# Patient Record
Sex: Female | Born: 1980 | Race: White | Hispanic: No | Marital: Married
Health system: Southern US, Community
[De-identification: ages and names within clinical notes are randomized; demographics above are authoritative.]

## PROBLEM LIST (undated history)

## (undated) DIAGNOSIS — F419 Anxiety disorder, unspecified: Secondary | ICD-10-CM

---

## 2005-07-28 ENCOUNTER — Emergency Department: Payer: Self-pay | Admitting: Emergency Medicine

## 2012-01-19 ENCOUNTER — Encounter: Payer: Self-pay | Admitting: Maternal & Fetal Medicine

## 2012-01-30 ENCOUNTER — Observation Stay: Payer: Self-pay | Admitting: Obstetrics and Gynecology

## 2012-02-11 ENCOUNTER — Inpatient Hospital Stay: Payer: Self-pay | Admitting: Obstetrics and Gynecology

## 2012-02-11 LAB — CBC WITH DIFFERENTIAL/PLATELET
Basophil #: 0 10*3/uL (ref 0.0–0.1)
Eosinophil #: 0.2 10*3/uL (ref 0.0–0.7)
HCT: 34.8 % — ABNORMAL LOW (ref 35.0–47.0)
Lymphocyte %: 15.8 %
MCHC: 34.3 g/dL (ref 32.0–36.0)
MCV: 85 fL (ref 80–100)
Monocyte %: 6.8 %
RDW: 14.8 % — ABNORMAL HIGH (ref 11.5–14.5)

## 2012-02-12 LAB — HEPATIC FUNCTION PANEL A (ARMC)
Albumin: 2.6 g/dL — ABNORMAL LOW (ref 3.4–5.0)
Alkaline Phosphatase: 147 U/L — ABNORMAL HIGH (ref 50–136)
Bilirubin, Direct: <0.1 mg/dL (ref 0.00–0.20)
Bilirubin,Total: 0.2 mg/dL (ref 0.2–1.0)
SGPT (ALT): 12 U/L (ref 12–78)
Total Protein: 6 g/dL — ABNORMAL LOW (ref 6.4–8.2)

## 2012-02-13 LAB — HEMATOCRIT: HCT: 31 % — ABNORMAL LOW (ref 35.0–47.0)

## 2013-10-23 IMAGING — US US OB DETAIL+14 WK - NRPT [facility]
1 series · 14 of 28 positions shown · non-contrast
Comparison: none

[Series 1: us ob detail+14 wk - nrpt [facility] · 0.26mm/px · 14 of 77 slices shown]
[im 3/77]
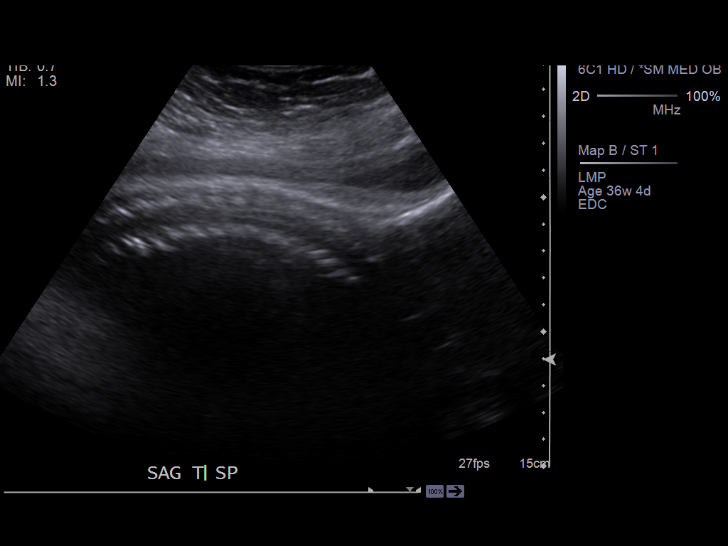
[im 9/77]
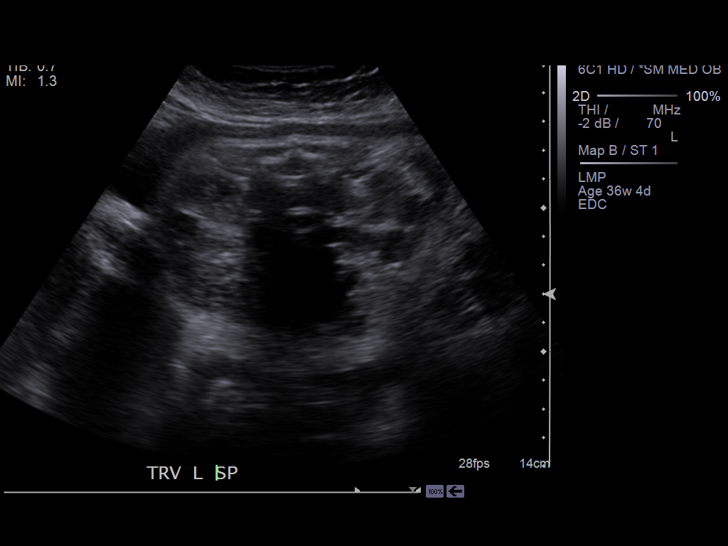
[im 15/77]
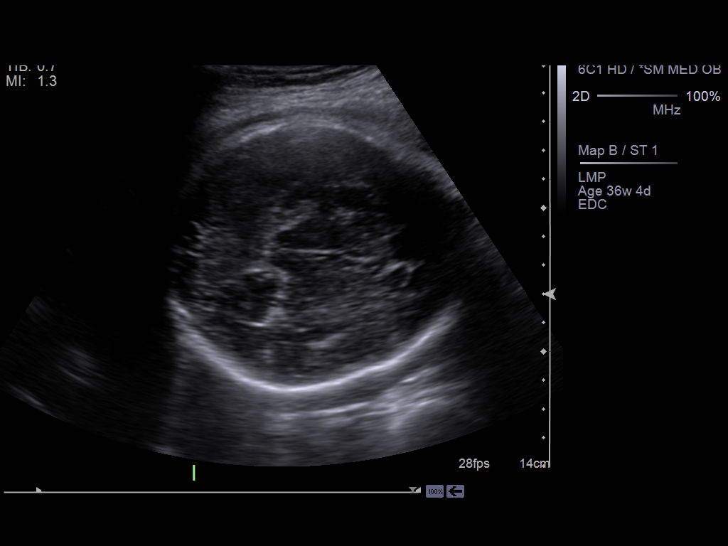
[im 20/77]
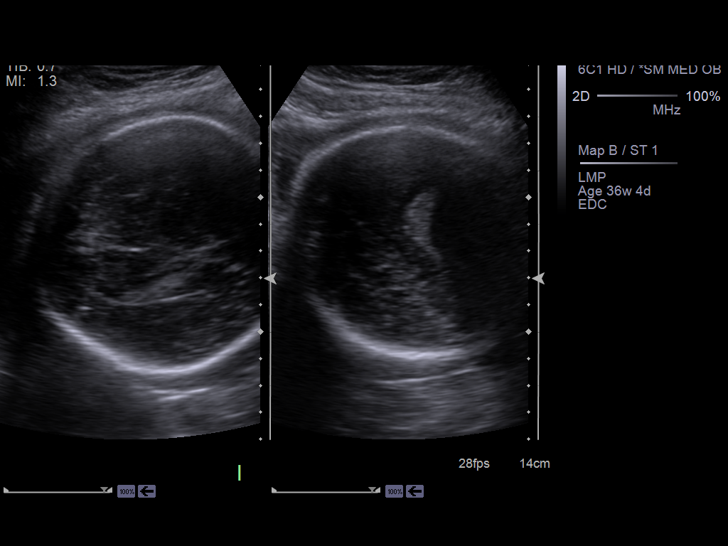
[im 26/77]
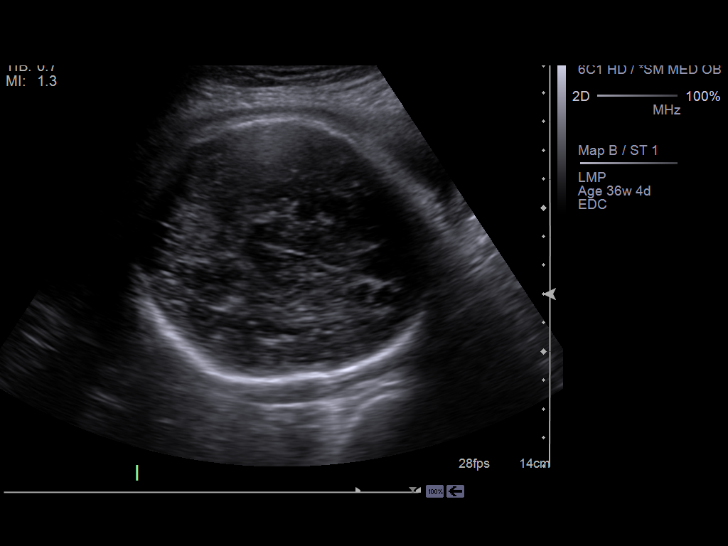
[im 31/77]
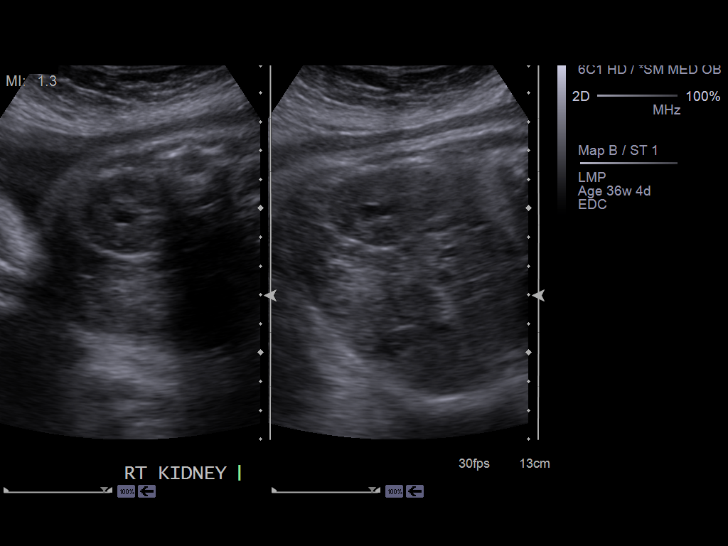
[im 37/77]
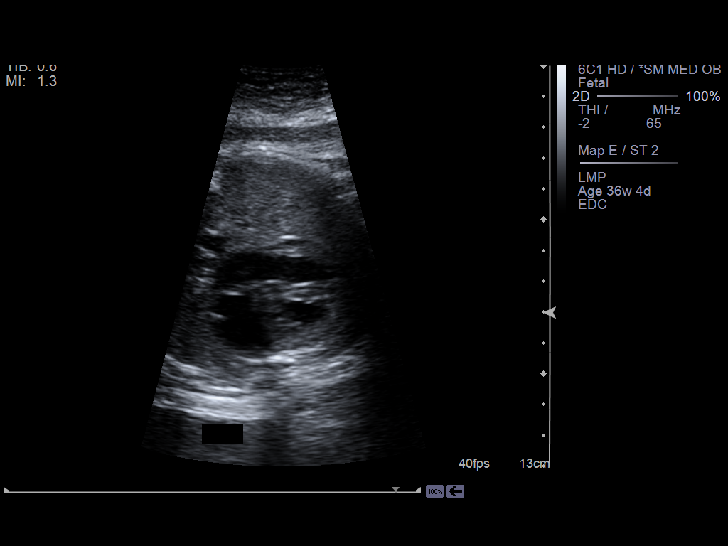
[im 43/77]
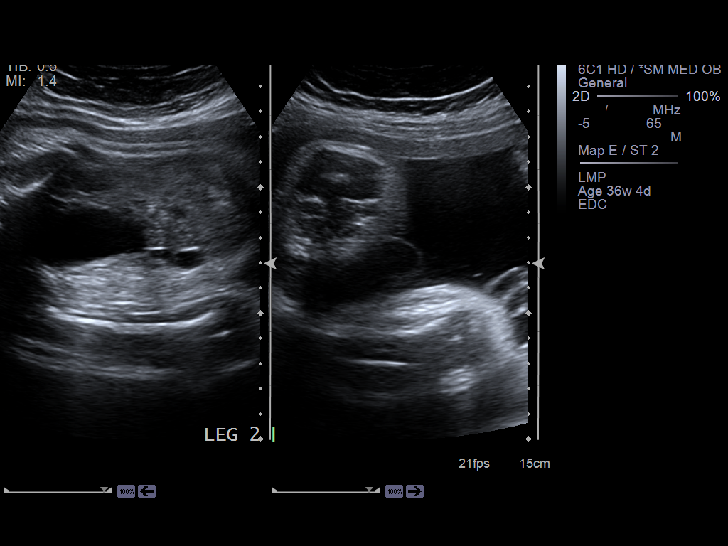
[im 48/77]
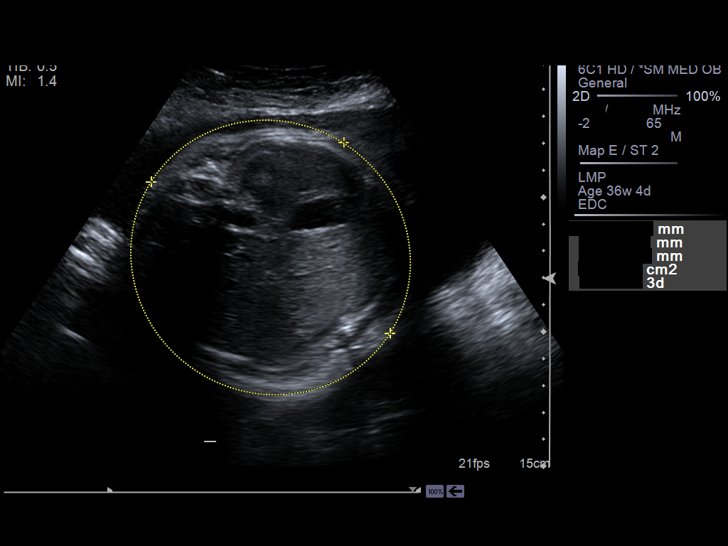
[im 54/77]
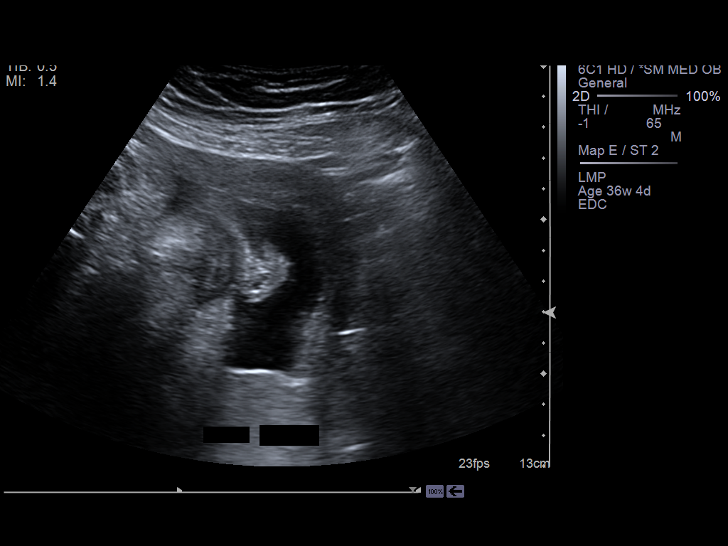
[im 60/77]
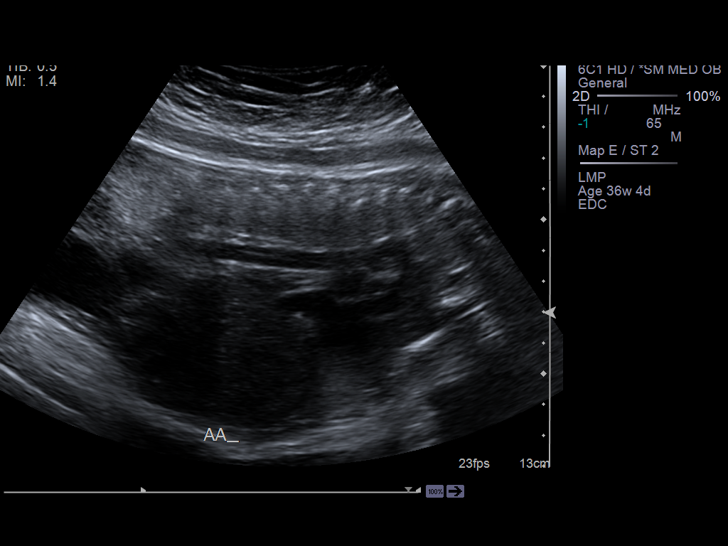
[im 65/77]
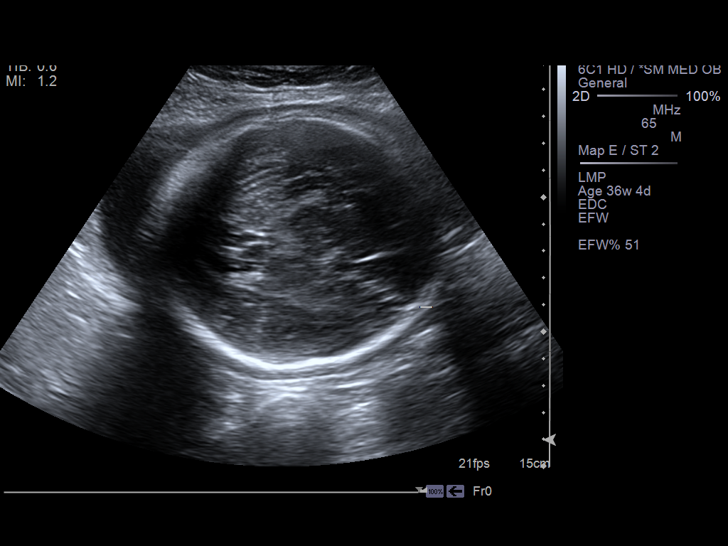
[im 71/77]
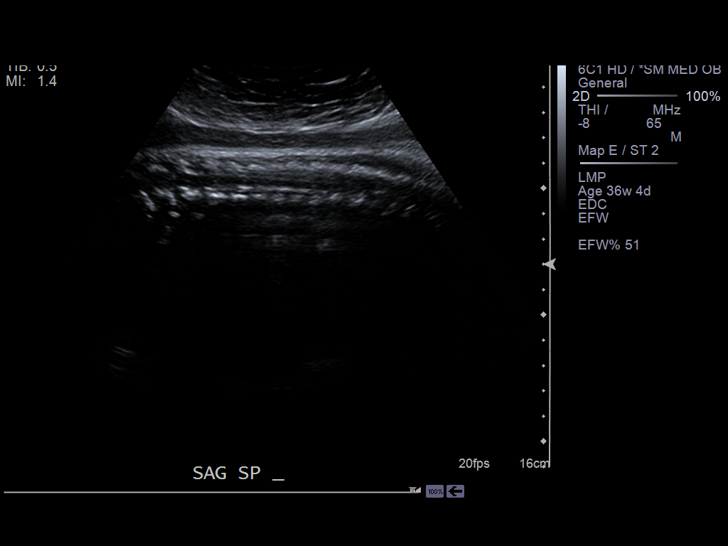
[im 77/77]
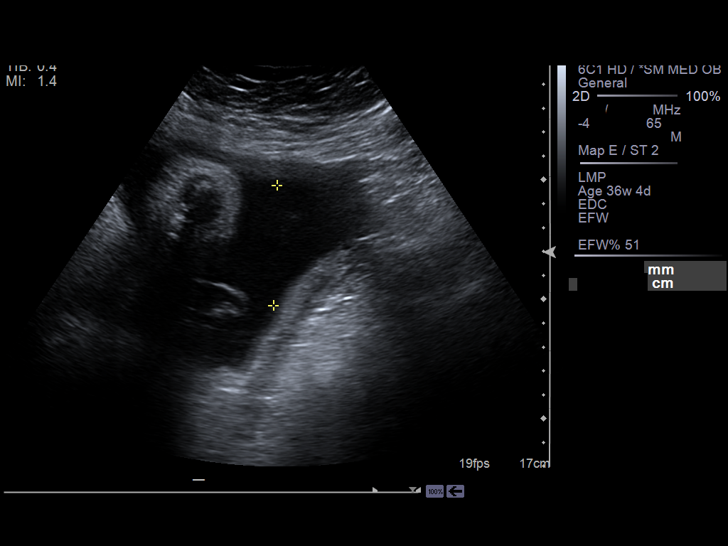

[14 of 28 positions shown; findings below may reference images not displayed]

IMAGES IMPORTED FROM THE [employer] WORKFLOW SYSTEM
NO DICTATION FOR STUDY

## 2014-11-07 NOTE — H&P (Signed)
L&D Evaluation:  History Expanded:   HPI 30 yopun WF G1 Po at 38 weeks 1 day who was seen in clinic and sent to l and for for dec FM. pt is obese BMI 43, and she has an unremarkable medical history.    Gravida 1    Term 0    PreTerm 0    Abortion 0    Living 0    Blood Type (Maternal) O positive    Group B Strep Results Maternal (Result >5wks must be treated as unknown) positive    Maternal HIV Negative    Maternal Syphilis Ab Nonreactive    Maternal Varicella Immune    Rubella Results (Maternal) immune    Maternal T-Dap Unknown    [facility]    Presents with decreased fetal movement    Patient's Medical History No Chronic Illness    Patient's Surgical History tonsillectomy    Medications Pre Natal Vitamins    Allergies NKDA    Social History none    Family History Non-Contributory   ROS:   ROS All systems were reviewed.  HEENT, CNS, GI, GU, Respiratory, CV, Renal and Musculoskeletal systems were found to be normal.   Exam:   Vital Signs stable    Urine Protein not completed    General no apparent distress    Mental Status clear    Chest clear    Heart normal sinus rhythm    Abdomen gravid, non-tender    Estimated Fetal Weight Average for gestational age    Fetal Position vertex    Fundal Height term    Pelvic ext ernal 4 and floppy internal two and VERY anterior,    Mebranes Intact    FHT normal rate with no decels, fht strictly reactive no decels irregular contractions    Fetal Heart Rate 140    Ucx irregular    Skin dry    Lymph no lymphadenopathy   Impression:   Impression early labor, decreased fetal movement   Plan:   Comments return to clinic next week.   Electronic Signatures: Adria DevonKlett, Jorel Gravlin (MD)  (Signed 02-Aug-13 13:14)  Authored: L&D Evaluation   Last Updated: 02-Aug-13 13:14 by Adria DevonKlett, Samwise Eckardt (MD)

## 2014-11-07 NOTE — H&P (Signed)
L&D Evaluation:  History:   HPI 34 yo  WF G1 P0 with EDC=02/12/2012 by LMP=05/07/2012 presents at 39 weeks 6 day for an IOL.Marland Kitchen. Patient has developed PUPPS and has been miserable with intense itching of abdomen, arms, and legs. Kenalog 0.1% cream has helped a little. She is also taking Atarax 25 mgm prn for itching. She has been under a great deal of stress R/T her husband's recent admission for SI/ alcohol abuse. ROS is positive for irregular contractions, and good FM. No VB or LOF. [place] [facility] PNC also remarkable for obesity( BMI 43), hemorrhoidal discomfort, and positive GBS. APTing since 36 weeks has been WNL. LAst EFW 3 weeks ago was 6#6oz. O POS, RI, VI    Presents with IOL    Patient's Medical History PUPPS, PCOS, Obesity    Patient's Surgical History tonsillectomy    Medications Pre Natal Vitamins  Atarax 25 mgm qid prn, Kenalog 0.1% cream  BID.    Allergies PCN, Sulfa    Social History none    Family History Non-Contributory   ROS:   ROS see HPI   Exam:   Vital Signs stable    Urine Protein not completed    General no apparent distress    Mental Status clear    Chest clear    Heart normal sinus rhythm, no murmur/gallop/rubs    Abdomen gravid, non-tender    Estimated Fetal Weight Average for gestational age, 977 1/2#    Fetal Position cephalic    Edema 1+    Reflexes 1+    Pelvic no external lesions, 3/50%/-2    Mebranes Intact    FHT normal rate with no decels, 130 baseline with accels to 170    FHT Description mod variability    Fetal Heart Rate 130    Ucx irregular, q7-8+ min apart    Skin maculopapular rash on abdomen, arms, and legs.   Impression:   Impression IUP at 39 6/7 weeks for IOL for PUPPS   Plan:   Plan EFM/NST, antibiotics for GBBS prophylaxis, Discussed the pros and cons of continued observation vs the risks of labor.  Aware of risks of hyperstimulation, FITL, failed IOL, and  C-section. Patient adamant about proceeding with IOL. Cervidil inserted.. Atarax for sleep.   Electronic Signatures: Trinna BalloonGutierrez, Adaja Wander L (CNM)  (Signed 15-Aug-13 06:38)  Authored: L&D Evaluation   Last Updated: 15-Aug-13 06:38 by Trinna BalloonGutierrez, Betsy Rosello L (CNM)

## 2018-11-30 ENCOUNTER — Other Ambulatory Visit: Payer: Self-pay | Admitting: Family Medicine

## 2018-11-30 ENCOUNTER — Other Ambulatory Visit (HOSPITAL_COMMUNITY): Payer: Self-pay | Admitting: Family Medicine

## 2018-11-30 DIAGNOSIS — R1013 Epigastric pain: Secondary | ICD-10-CM

## 2018-12-02 ENCOUNTER — Ambulatory Visit
Admission: RE | Admit: 2018-12-02 | Discharge: 2018-12-02 | Disposition: A | Discharge status: Home / Self Care | Payer: BC Managed Care – PPO | Source: Ambulatory Visit | Attending: Family Medicine | Admitting: Family Medicine

## 2018-12-02 ENCOUNTER — Other Ambulatory Visit: Payer: Self-pay

## 2018-12-02 DIAGNOSIS — R1013 Epigastric pain: Secondary | ICD-10-CM | POA: Insufficient documentation

## 2019-09-04 ENCOUNTER — Ambulatory Visit: Payer: BC Managed Care – PPO | Attending: Internal Medicine

## 2019-09-04 DIAGNOSIS — Z23 Encounter for immunization: Secondary | ICD-10-CM | POA: Insufficient documentation

## 2019-09-04 NOTE — Progress Notes (Signed)
   Covid-19 Vaccination Clinic  Name:  Sara Garner    MRN: [id number] DOB: 04-Jun-1981  09/04/2019  Ms. Ask was observed post Covid-19 immunization for 15 minutes without incident. She was provided with Vaccine Information Sheet and instruction to access the V-Safe system.   [name] was instructed to call 911 with any severe reactions post vaccine: Marland Kitchen Difficulty breathing  . Swelling of face and throat  . A fast heartbeat  . A bad rash all over body  . Dizziness and weakness   Immunizations Administered    Name Date Dose VIS Date Route   Pfizer COVID-19 Vaccine 09/04/2019  8:48 AM 0.3 mL 06/10/2019 Intramuscular   Manufacturer: [employer], [employer]   Lot: [id number]   NDC: [address]-5

## 2019-09-27 ENCOUNTER — Ambulatory Visit: Payer: BC Managed Care – PPO | Attending: Internal Medicine

## 2019-09-27 DIAGNOSIS — Z23 Encounter for immunization: Secondary | ICD-10-CM

## 2019-09-27 NOTE — Progress Notes (Signed)
   Covid-19 Vaccination Clinic  Name:  Sara Garner    MRN: [id number] DOB: 02/02/1981  09/27/2019  Sara Garner was observed post Covid-19 immunization for 15 minutes without incident. She was provided with Vaccine Information Sheet and instruction to access the V-Safe system.   Sara Garner was instructed to call 911 with any severe reactions post vaccine: Marland Kitchen Difficulty breathing  . Swelling of face and throat  . A fast heartbeat  . A bad rash all over body  . Dizziness and weakness   Immunizations Administered    Name Date Dose VIS Date Route   Pfizer COVID-19 Vaccine 09/27/2019  4:27 PM 0.3 mL 06/10/2019 Intramuscular   Manufacturer: [employer], [employer]   Lot: [id number]   NDC: [address]-3

## 2020-09-05 IMAGING — US ULTRASOUND ABDOMEN LIMITED
1 series · 14 of 25 positions shown · non-contrast
Comparison: None.

CLINICAL DATA: Epigastric region pain

EXAM:
ULTRASOUND ABDOMEN LIMITED RIGHT UPPER QUADRANT

[Series 1: ultrasound abdomen limited · 14 of 51 slices shown]
[im 1/51]
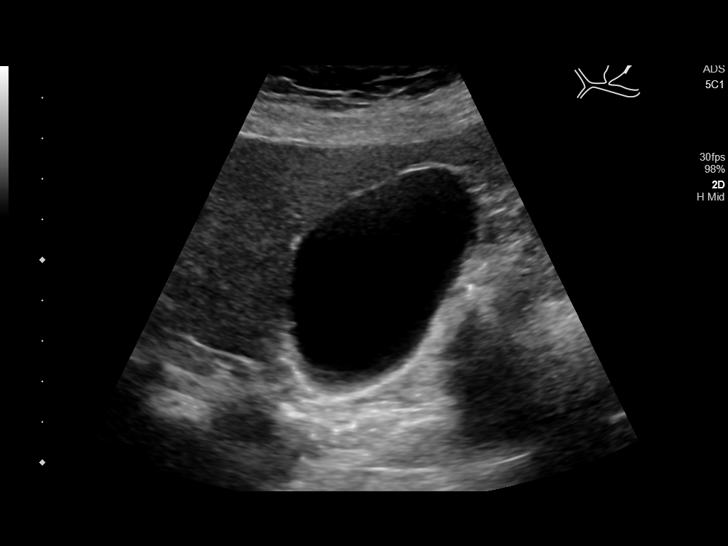
[im 5/51]
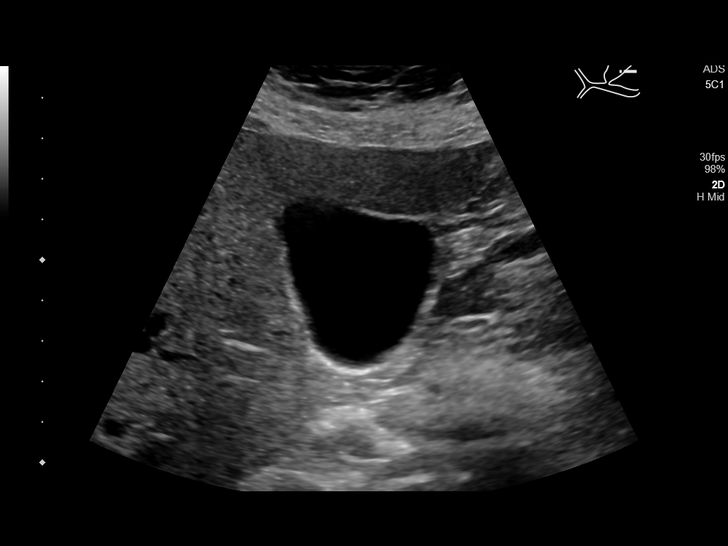
[im 9/51]
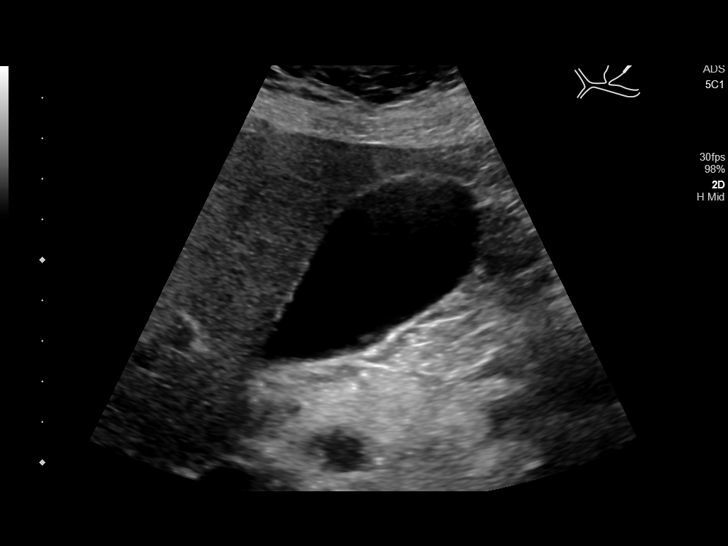
[im 13/51]
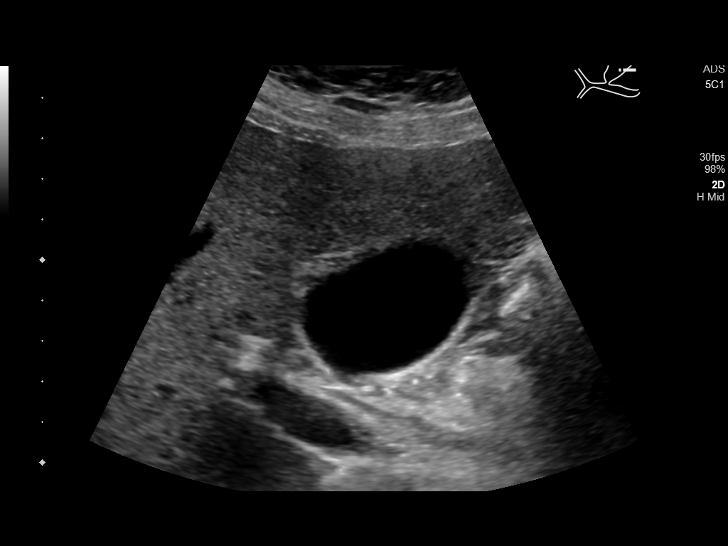
[im 17/51]
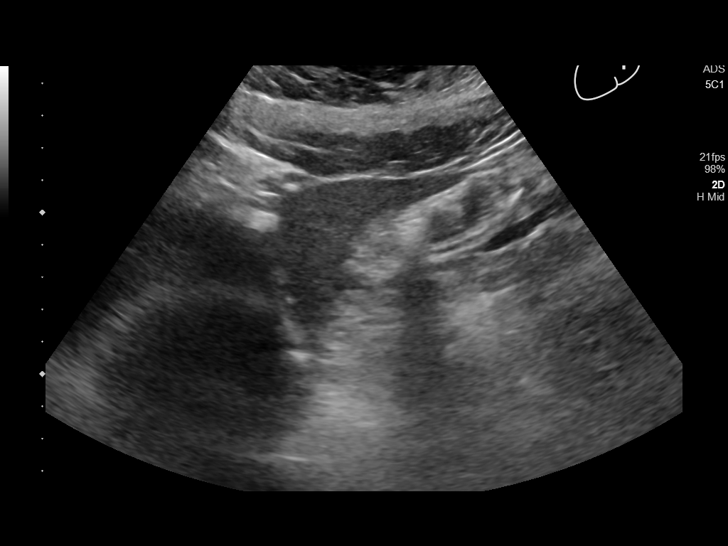
[im 19/51]
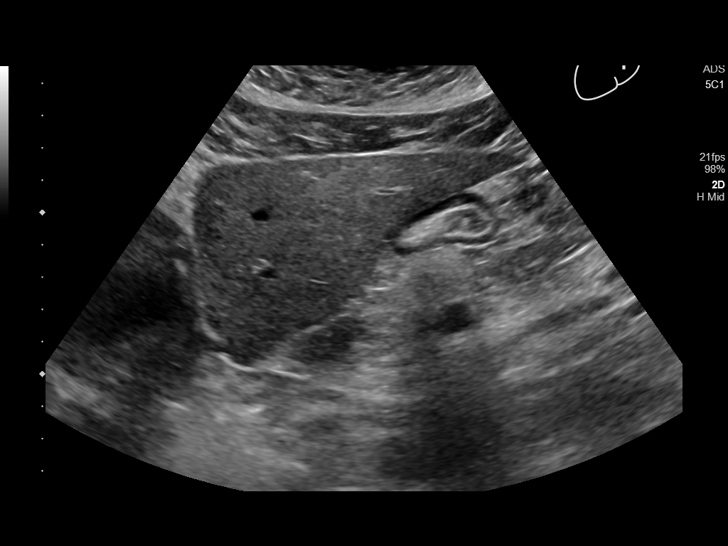
[im 23/51]
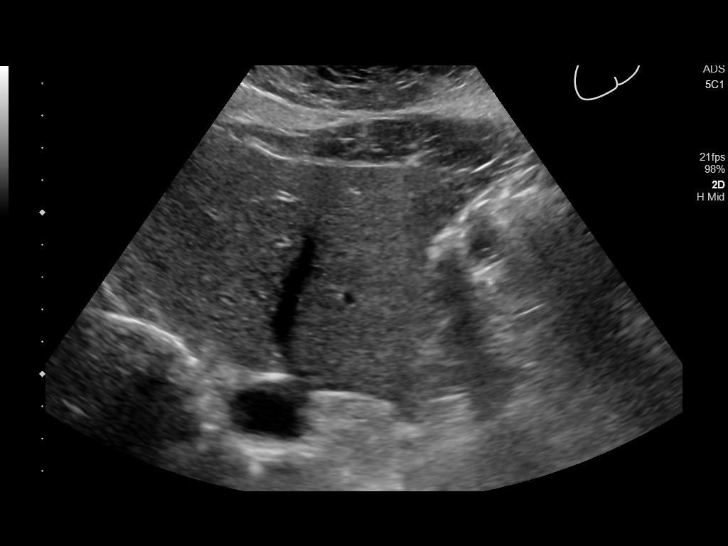
[im 28/51]
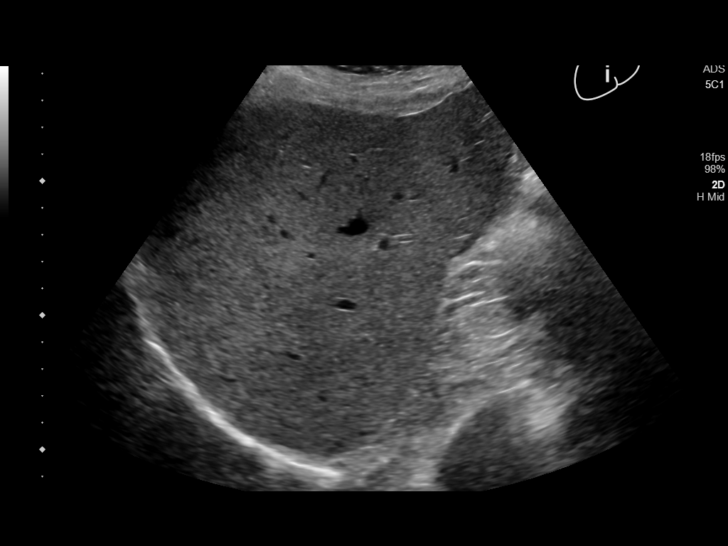
[im 32/51]
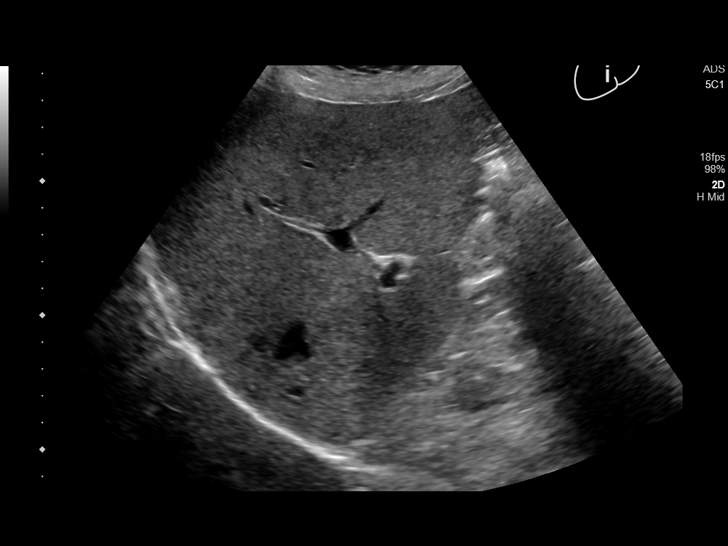
[im 34/51]
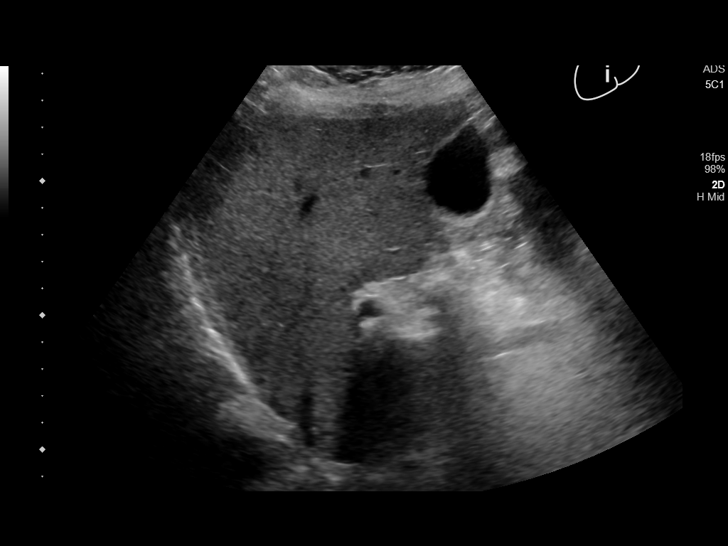
[im 38/51]
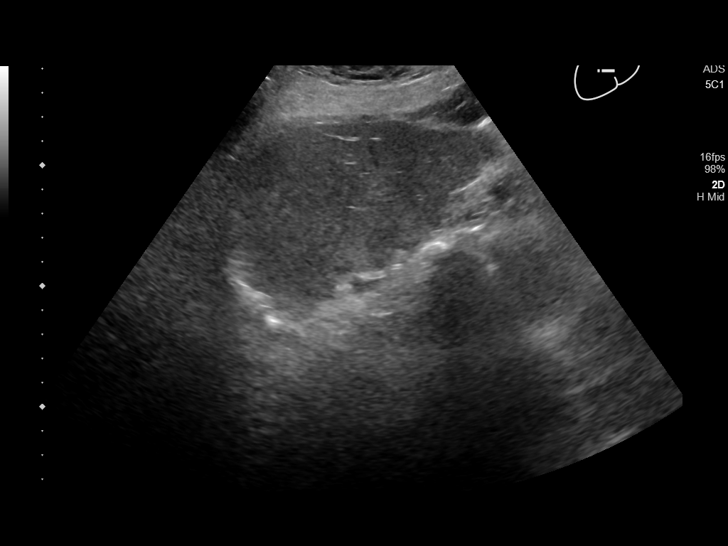
[im 42/51]
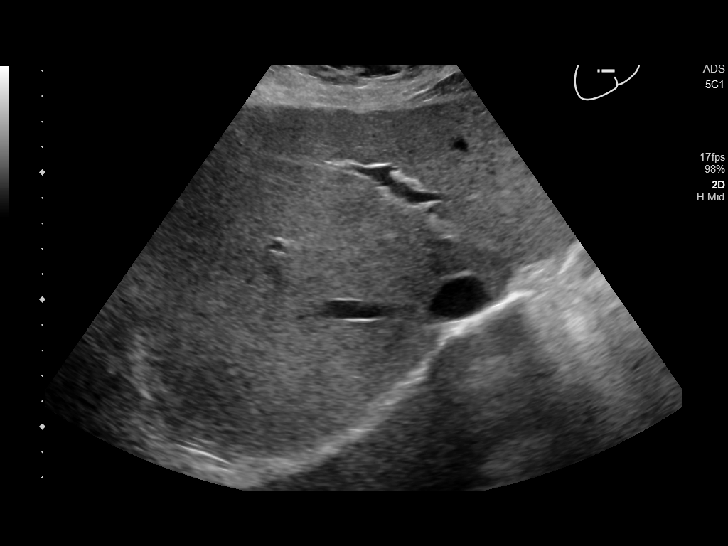
[im 46/51]
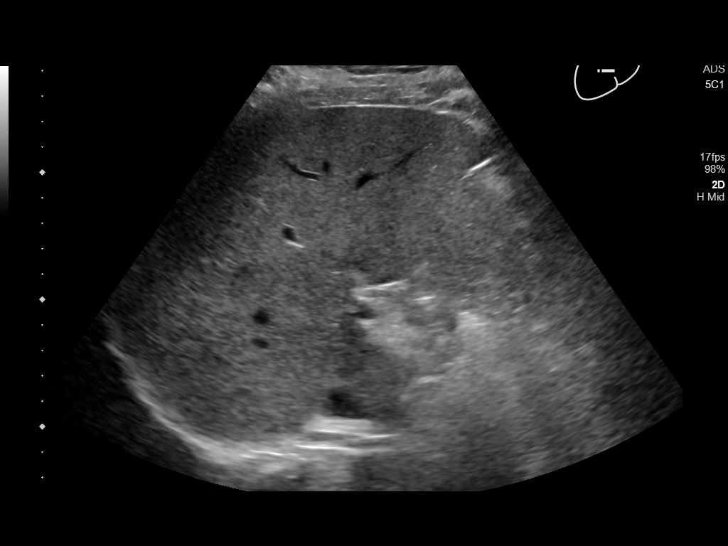
[im 51/51]
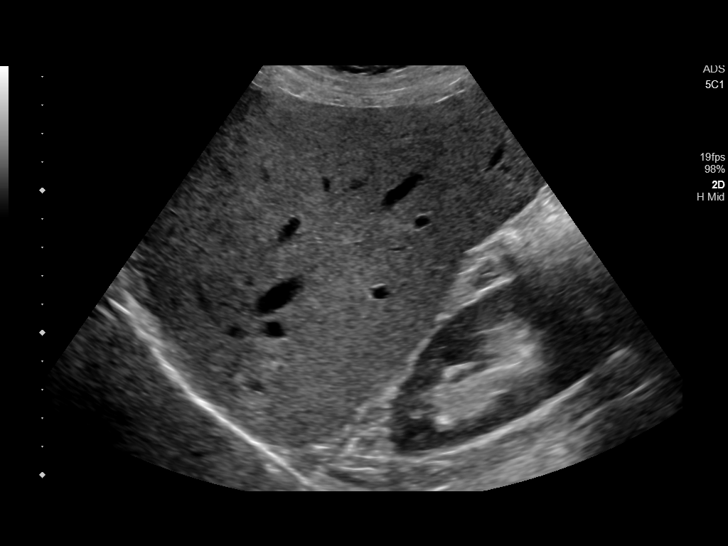

[14 of 25 positions shown; findings below may reference images not displayed]

FINDINGS: Gallbladder:

No gallstones or wall thickening visualized. There is no
pericholecystic fluid. No sonographic Murphy sign noted by
sonographer.

Common bile duct:

Diameter: 2 mm. No intrahepatic or extrahepatic biliary duct
dilatation.

Liver:

No focal lesion identified. Within normal limits in parenchymal
echogenicity. Portal vein is patent on color Doppler imaging with
normal direction of blood flow towards the liver.
IMPRESSION: Study within normal limits.

## 2022-12-29 ENCOUNTER — Encounter: Payer: Self-pay | Admitting: Emergency Medicine

## 2022-12-29 ENCOUNTER — Other Ambulatory Visit: Payer: Self-pay

## 2022-12-29 ENCOUNTER — Emergency Department
Admission: EM | Admit: 2022-12-29 | Discharge: 2022-12-29 | Disposition: A | Discharge status: Home / Self Care | Payer: Commercial Managed Care - PPO | Attending: Emergency Medicine | Admitting: Emergency Medicine

## 2022-12-29 ENCOUNTER — Emergency Department: Payer: Commercial Managed Care - PPO

## 2022-12-29 DIAGNOSIS — R0789 Other chest pain: Secondary | ICD-10-CM

## 2022-12-29 DIAGNOSIS — R079 Chest pain, unspecified: Secondary | ICD-10-CM

## 2022-12-29 HISTORY — DX: Anxiety disorder, unspecified: F41.9

## 2022-12-29 LAB — BASIC METABOLIC PANEL
Anion gap: 9 (ref 5–15)
BUN: 16 mg/dL (ref 6–20)
CO2: 25 mmol/L (ref 22–32)
Calcium: 8.7 mg/dL — ABNORMAL LOW (ref 8.9–10.3)
Chloride: 104 mmol/L (ref 98–111)
Creatinine, Ser: 0.74 mg/dL (ref 0.44–1.00)
GFR, Estimated: >60 mL/min (ref >60)
Glucose, Bld: 93 mg/dL (ref 70–99)
Potassium: 3.7 mmol/L (ref 3.5–5.1)
Sodium: 138 mmol/L (ref 135–145)

## 2022-12-29 LAB — CBC
HCT: 37.9 % (ref 36.0–46.0)
Hemoglobin: 12.6 g/dL (ref 12.0–15.0)
MCH: 29.7 pg (ref 26.0–34.0)
MCHC: 33.2 g/dL (ref 30.0–36.0)
MCV: 89.4 fL (ref 80.0–100.0)
Platelets: 256 10*3/uL (ref 150–400)
RBC: 4.24 MIL/uL (ref 3.87–5.11)
RDW: 13.1 % (ref 11.5–15.5)
WBC: 10.7 10*3/uL — ABNORMAL HIGH (ref 4.0–10.5)
nRBC: 0 % (ref 0.0–0.2)

## 2022-12-29 LAB — TROPONIN I (HIGH SENSITIVITY): Troponin I (High Sensitivity): <2 ng/L (ref <18)

## 2022-12-29 MED ORDER — LORAZEPAM 1 MG PO TABS
1.0000 mg | ORAL_TABLET | Freq: Once | ORAL | Status: AC
  Administered 2022-12-29: 1 mg via ORAL
  Filled 2022-12-29: qty 1

## 2022-12-29 MED ORDER — KETOROLAC TROMETHAMINE 30 MG/ML IJ SOLN
30.0000 mg | Freq: Once | INTRAMUSCULAR | Status: AC
  Administered 2022-12-29: 30 mg via INTRAVENOUS
  Filled 2022-12-29: qty 1

## 2022-12-29 MED ORDER — IOHEXOL 350 MG/ML SOLN
75.0000 mL | Freq: Once | INTRAVENOUS | Status: AC | PRN
  Administered 2022-12-29: 75 mL via INTRAVENOUS

## 2022-12-29 NOTE — ED Notes (Signed)
Pt to ED with complaints of sharp inspiratory L chest pain since a few days. No SOB. Skin dry. Family at bedside.

## 2022-12-29 NOTE — ED Triage Notes (Signed)
Patient to ED via POV fro left sided chest pain- intermittent for the past few weeks but worse today. States pain radiates into back, left arm and jaw. Denies cardiac history. Pain worse when taking a deep breath or moving.

## 2022-12-29 NOTE — ED Notes (Signed)
ED Provider at bedside. 

## 2022-12-29 NOTE — Discharge Instructions (Addendum)
Your exam, labs, EKG, chest x-ray, and CT exam are all normal reassuring.  Your symptoms are not due to a heart attack or blood clot.  You should follow-up with cardiology or your primary care provider for ongoing symptom management.  Continue with your home medications as prescribed.

## 2022-12-29 NOTE — ED Provider Notes (Signed)
[facility] Provider Note    Event Date/Time   First MD Initiated Contact with Patient 12/29/22 1824     (approximate)   History   Chest Pain   HPI  Sara Garner is a 42 y.o. female with no significant past medical history presents with complaints of left-sided chest discomfort, she reports has been ongoing for nearly a week intermittently but has become more significant over the last 24 hours.  She describes it in the left anterior chest.  No shortness of breath, no hemoptysis.  No history of PE or DVT, does have Mirena IUD     Physical Exam   Triage Vital Signs: ED Triage Vitals [12/29/22 1809]  Enc Vitals Group     BP 133/72     Pulse Rate 77     Resp 18     Temp 98.7 F (37.1 C)     Temp Source Oral     SpO2 100 %     Weight 90.7 kg (200 lb)     Height 1.626 m (5\' 4" )     Head Circumference      Peak Flow      Pain Score 5     Pain Loc      Pain Edu?      Excl. in GC?     Most recent vital signs: Vitals:   12/29/22 1809  BP: 133/72  Pulse: 77  Resp: 18  Temp: 98.7 F (37.1 C)  SpO2: 100%     General: Awake, no distress.  CV:  Good peripheral perfusion.  Resp:  Normal effort.  Clear to auscultation bilaterally Abd:  No distention.  Other:     ED Results / Procedures / Treatments   Labs (all labs ordered are listed, but only abnormal results are displayed) Labs Reviewed  BASIC METABOLIC PANEL - Abnormal; Notable for the following components:      Result Value   Calcium 8.7 (*)    All other components within normal limits  CBC - Abnormal; Notable for the following components:   WBC 10.7 (*)    All other components within normal limits  POC URINE PREG, ED  TROPONIN I (HIGH SENSITIVITY)     EKG  ED ECG REPORT I, Jene Every, the attending physician, personally viewed and interpreted this ECG.  Date: 12/29/2022  Rhythm: normal sinus rhythm QRS Axis: normal Intervals: normal ST/T Wave abnormalities:  normal Narrative Interpretation: no evidence of acute ischemia    RADIOLOGY  Chest x-ray viewed interpret by me, no acute abnormality   PROCEDURES:  Critical Care performed:   Procedures   MEDICATIONS ORDERED IN ED: Medications  ketorolac (TORADOL) 30 MG/ML injection 30 mg (has no administration in time range)     IMPRESSION / MDM / ASSESSMENT AND PLAN / ED COURSE  I reviewed the triage vital signs and the nursing notes. Patient's presentation is most consistent with acute presentation with potential threat to life or bodily function.  Patient presents with chest pain as detailed above.  Differential includes ACS, PE, musculoskeletal chest pain, pneumonia/pneumothorax  EKG is quite reassuring, high sensitive troponin is normal, not consistent with ACS.  Mild elevation of white blood cell count is Nonspecific  Chest x-ray without evidence of pneumonia or pneumothorax.  Will proceed with CT angiography to rule out PE.  Patient treated with IV Toradol for discomfort  I have asked my colleague to follow-up on CT angiography, if normal, anticipate discharge outpatient follow-up with cardiology  FINAL CLINICAL IMPRESSION(S) / ED DIAGNOSES   Final diagnoses:  Atypical chest pain     Rx / DC Orders   ED Discharge Orders     None        Note:  This document was prepared using Dragon voice recognition software and may include unintentional dictation errors.   Jene Every, MD 12/29/22 [phone]

## 2022-12-29 NOTE — ED Provider Notes (Signed)
----------------------------------------- 10:18 PM on 12/29/2022 -----------------------------------------  Blood pressure 133/72, pulse 77, temperature 98.7 F (37.1 C), temperature source Oral, resp. rate 18, height 5\' 4"  (1.626 m), weight 90.7 kg, SpO2 100 %.  Assuming care from Dr. Jene Every.  In short, Sara Garner is a 42 y.o. female with a chief complaint of Chest Pain .  Refer to the original H&P for additional details.  The current plan of care is to await pending CT angio disposition the patient accordingly.  ____________________________________________    ED Results / Procedures / Treatments   Labs (all labs ordered are listed, but only abnormal results are displayed) Labs Reviewed  BASIC METABOLIC PANEL - Abnormal; Notable for the following components:      Result Value   Calcium 8.7 (*)    All other components within normal limits  CBC - Abnormal; Notable for the following components:   WBC 10.7 (*)    All other components within normal limits  POC URINE PREG, ED  TROPONIN I (HIGH SENSITIVITY)     EKG    RADIOLOGY  I personally viewed and evaluated these images as part of my medical decision making, as well as reviewing the written report by the radiologist.  ED Provider Interpretation: No acute findings  CT Angio Chest PE W and/or Wo Contrast  Result Date: 12/29/2022 CLINICAL DATA:  Left chest pain, evaluate for PE EXAM: CT ANGIOGRAPHY CHEST WITH CONTRAST TECHNIQUE: Multidetector CT imaging of the chest was performed using the standard protocol during bolus administration of intravenous contrast. Multiplanar CT image reconstructions and MIPs were obtained to evaluate the vascular anatomy. RADIATION DOSE REDUCTION: This exam was performed according to the departmental dose-optimization program which includes automated exposure control, adjustment of the mA and/or kV according to patient size and/or use of iterative reconstruction technique. CONTRAST:   75mL OMNIPAQUE IOHEXOL 350 MG/ML SOLN COMPARISON:  Chest radiographs dated 12/29/2022 FINDINGS: Cardiovascular: Satisfactory opacification of the bilateral pulmonary arteries to the segmental level. No evidence of pulmonary embolism. Although not tailored for evaluation of the thoracic aorta, there is no evidence thoracic aortic aneurysm or dissection. The heart is normal in size.  No pericardial effusion. Mediastinum/Nodes: No suspicious mediastinal lymphadenopathy. Visualized thyroid is unremarkable. Lungs/Pleura: Very mild dependent atelectasis in the bilateral lower lobes. No focal consolidation. No suspicious pulmonary nodules. No pleural effusion or pneumothorax. Upper Abdomen: Visualized upper abdomen is unremarkable. Musculoskeletal: Mild degenerative changes of the visualized thoracolumbar spine. Review of the MIP images confirms the above findings. IMPRESSION: No evidence of pulmonary embolism. Negative CT chest. Electronically Signed   By: Charline Bills M.D.   On: 12/29/2022 20:32   DG Chest 2 View  Result Date: 12/29/2022 CLINICAL DATA:  Chest pain. EXAM: CHEST - 2 VIEW COMPARISON:  None Available. FINDINGS: The heart size and mediastinal contours are within normal limits. Both lungs are clear. The visualized skeletal structures are unremarkable. IMPRESSION: No active cardiopulmonary disease. Electronically Signed   By: Larose Hires D.O.   On: 12/29/2022 19:04     PROCEDURES:  Critical Care performed: No  Procedures   MEDICATIONS ORDERED IN ED: Medications  ketorolac (TORADOL) 30 MG/ML injection 30 mg (30 mg Intravenous Given 12/29/22 1936)  iohexol (OMNIPAQUE) 350 MG/ML injection 75 mL (75 mLs Intravenous Contrast Given 12/29/22 1940)  LORazepam (ATIVAN) tablet 1 mg (1 mg Oral Given 12/29/22 2226)     IMPRESSION / MDM / ASSESSMENT AND PLAN / ED COURSE  I reviewed the triage vital signs and the nursing  notes.                              Differential diagnosis includes, but is not  limited to,  ACS, aortic dissection, pulmonary embolism, cardiac tamponade, pneumothorax, pneumonia, pericarditis, myocarditis, GI-related causes including esophagitis/gastritis, and musculoskeletal chest wall pain.     Patient's presentation is most consistent with acute complicated illness / injury requiring diagnostic workup.  Patient's diagnosis is consistent with nonspecific chest pain. Patient will be discharged home with instructions to take OTC Tylenol Motrin as needed. Patient is to follow up with her primary provider or cardiology as referred, as needed or otherwise directed. Patient is given ED precautions to return to the ED for any worsening or new symptoms.     FINAL CLINICAL IMPRESSION(S) / ED DIAGNOSES   Final diagnoses:  Atypical chest pain  Nonspecific chest pain     Rx / DC Orders   ED Discharge Orders          Ordered    Ambulatory referral to Cardiology       Comments: If you have not heard from the Cardiology office within the next 72 hours please call [phone].   12/29/22 2223             Note:  This document was prepared using Dragon voice recognition software and may include unintentional dictation errors.    Lissa Hoard, PA-C 12/30/22 Erin Fulling, MD 12/30/22 1047

## 2023-01-09 ENCOUNTER — Encounter: Payer: Commercial Managed Care - PPO | Admitting: Cardiology
